# Patient Record
Sex: Female | Born: 1975
Health system: Southern US, Community
[De-identification: ages and names within clinical notes are randomized; demographics above are authoritative.]

## PROBLEM LIST (undated history)

## (undated) DIAGNOSIS — G5600 Carpal tunnel syndrome, unspecified upper limb: Secondary | ICD-10-CM

## (undated) DIAGNOSIS — I10 Essential (primary) hypertension: Secondary | ICD-10-CM

## (undated) DIAGNOSIS — E079 Disorder of thyroid, unspecified: Secondary | ICD-10-CM

## (undated) HISTORY — DX: Carpal tunnel syndrome, unspecified upper limb: G56.00

## (undated) HISTORY — PX: THYROID SURGERY: SHX805

## (undated) HISTORY — DX: Disorder of thyroid, unspecified: E07.9

## (undated) HISTORY — DX: Essential (primary) hypertension: I10

---

## 1999-03-23 ENCOUNTER — Emergency Department (HOSPITAL_COMMUNITY): Admission: EM | Admit: 1999-03-23 | Discharge: 1999-03-23 | Payer: Self-pay | Admitting: Endocrinology

## 2002-08-31 ENCOUNTER — Other Ambulatory Visit: Admission: RE | Admit: 2002-08-31 | Discharge: 2002-08-31 | Payer: Self-pay | Admitting: Obstetrics & Gynecology

## 2003-01-18 ENCOUNTER — Encounter: Admission: RE | Admit: 2003-01-18 | Discharge: 2003-01-18 | Payer: Self-pay | Admitting: Internal Medicine

## 2003-01-18 ENCOUNTER — Encounter: Payer: Self-pay | Admitting: Internal Medicine

## 2005-03-19 ENCOUNTER — Encounter: Admission: RE | Admit: 2005-03-19 | Discharge: 2005-03-19 | Payer: Self-pay | Admitting: Otolaryngology

## 2005-06-02 ENCOUNTER — Ambulatory Visit (HOSPITAL_COMMUNITY): Admission: RE | Admit: 2005-06-02 | Discharge: 2005-06-02 | Payer: Self-pay | Admitting: Endocrinology

## 2005-06-16 ENCOUNTER — Encounter (INDEPENDENT_AMBULATORY_CARE_PROVIDER_SITE_OTHER): Payer: Self-pay | Admitting: Specialist

## 2005-06-16 ENCOUNTER — Other Ambulatory Visit: Admission: RE | Admit: 2005-06-16 | Discharge: 2005-06-16 | Payer: Self-pay | Admitting: Interventional Radiology

## 2005-06-16 ENCOUNTER — Encounter: Admission: RE | Admit: 2005-06-16 | Discharge: 2005-06-16 | Payer: Self-pay | Admitting: Endocrinology

## 2005-08-21 ENCOUNTER — Encounter: Admission: RE | Admit: 2005-08-21 | Discharge: 2005-08-21 | Payer: Self-pay | Admitting: Internal Medicine

## 2005-09-02 ENCOUNTER — Encounter: Admission: RE | Admit: 2005-09-02 | Discharge: 2005-12-01 | Payer: Self-pay | Admitting: Specialist

## 2005-11-26 ENCOUNTER — Encounter: Admission: RE | Admit: 2005-11-26 | Discharge: 2005-11-26 | Payer: Self-pay | Admitting: Internal Medicine

## 2005-11-30 ENCOUNTER — Ambulatory Visit (HOSPITAL_COMMUNITY): Admission: RE | Admit: 2005-11-30 | Discharge: 2005-11-30 | Payer: Self-pay | Admitting: Interventional Cardiology

## 2005-12-24 ENCOUNTER — Encounter: Admission: RE | Admit: 2005-12-24 | Discharge: 2005-12-24 | Payer: Self-pay | Admitting: Internal Medicine

## 2006-07-07 ENCOUNTER — Other Ambulatory Visit: Admission: RE | Admit: 2006-07-07 | Discharge: 2006-07-07 | Payer: Self-pay | Admitting: Obstetrics and Gynecology

## 2006-11-30 IMAGING — CT CT ABDOMEN W/ CM
2 of 5 series · 14 of 32 positions shown, 19 images · IV contrast (READICAT/WATER & [ID] OMNI 300)
Comparison: MRA of the abdomen performed on 11/30/05.

CLINICAL DATA: Abdominal and pelvic pain. 
 ABDOMEN CT WITH CONTRAST:
TECHNIQUE: Multidetector CT imaging of the abdomen was performed following the standard protocol during bolus administration of intravenous contrast.
 Contrast:  Oral contrast and 125 cc Omnipaque 300
TECHNIQUE: Multidetector CT imaging of the pelvis was performed following the standard protocol during bolus administration of intravenous contrast.

[Series 2: routine abdomen · axial · 0.92mm/px · z∈[-305,+20]mm · 6 of 92 slices shown, 11 images]
[im 14/92  soft-tissue]
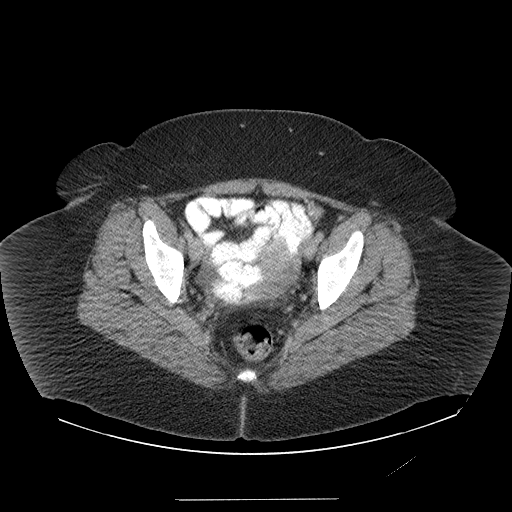
[im 14/92  bone]
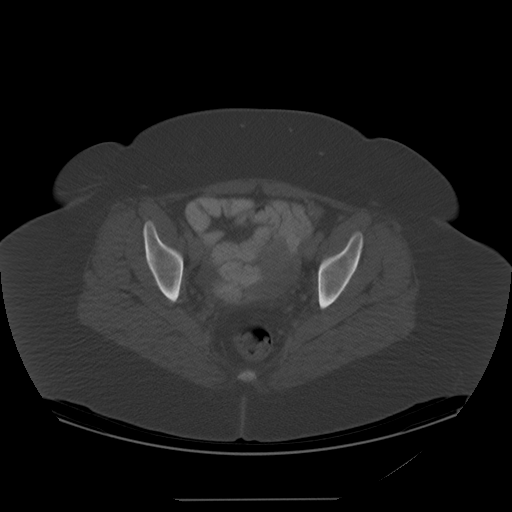
[im 27/92  soft-tissue]
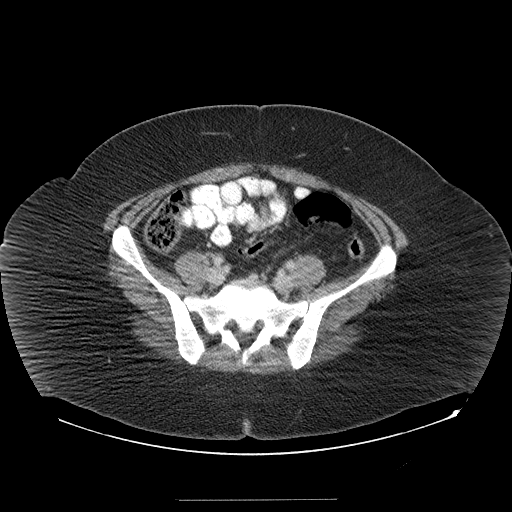
[im 40/92  soft-tissue]
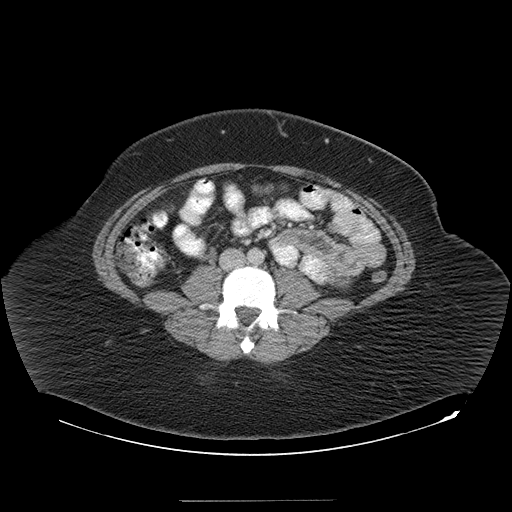
[im 40/92  lung]
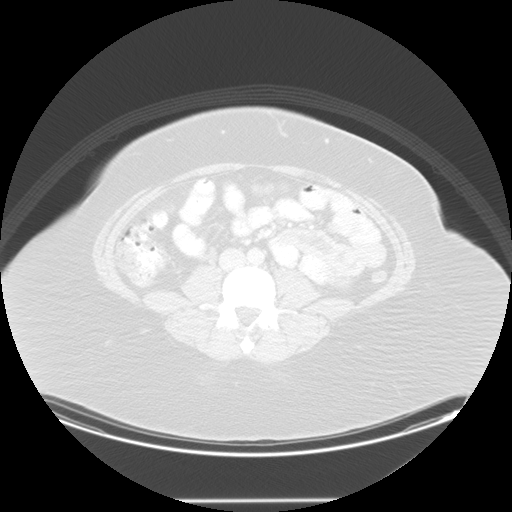
[im 53/92  soft-tissue]
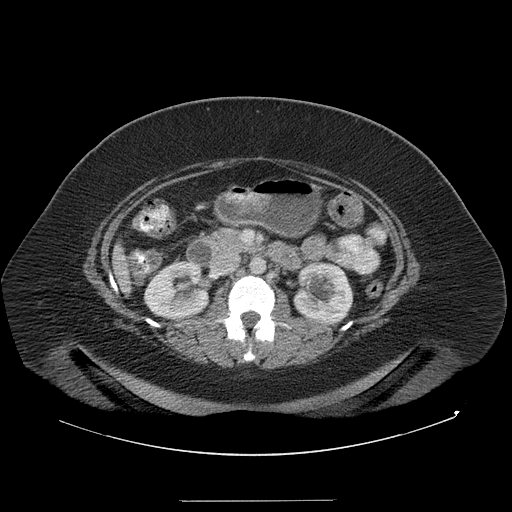
[im 53/92  lung]
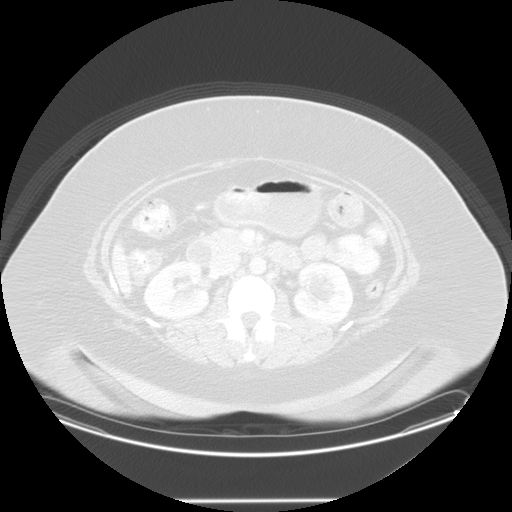
[im 66/92  soft-tissue]
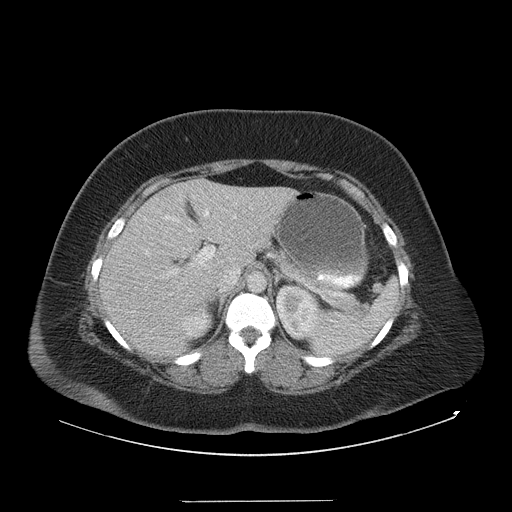
[im 66/92  lung]
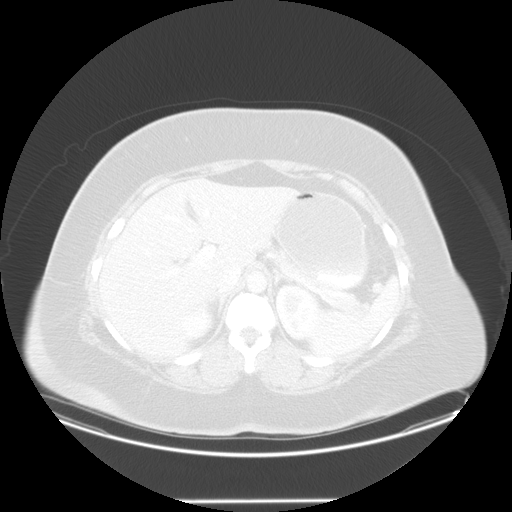
[im 79/92  soft-tissue]
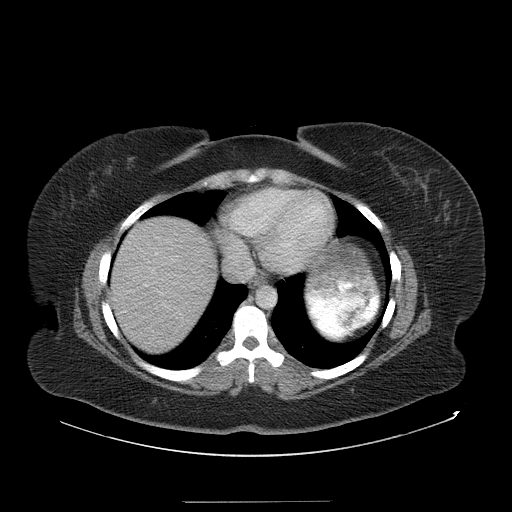
[im 79/92  lung]
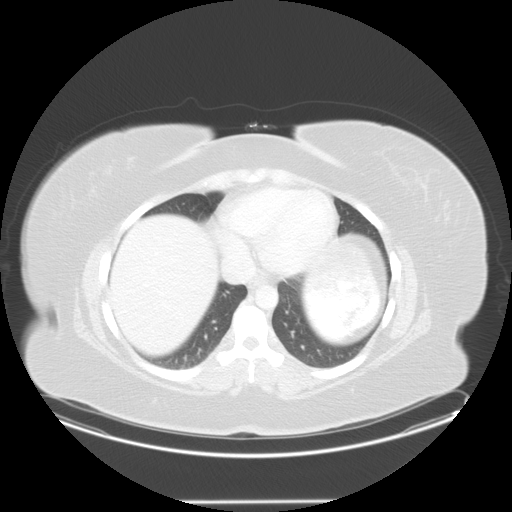

[Series 103: reformatted · sagittal · 1.00mm/px · 8 of 158 slices shown]
[im 14/158  soft-tissue]
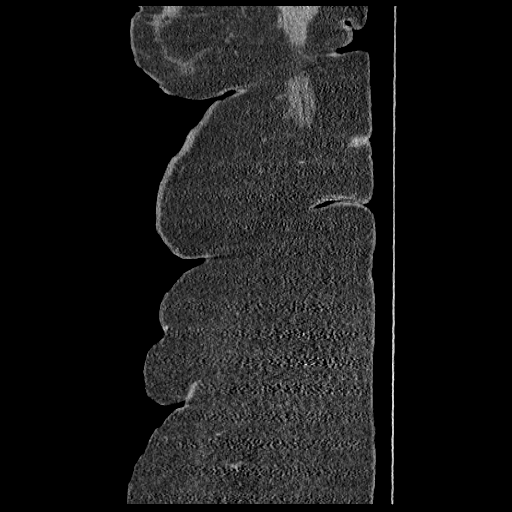
[im 40/158  soft-tissue]
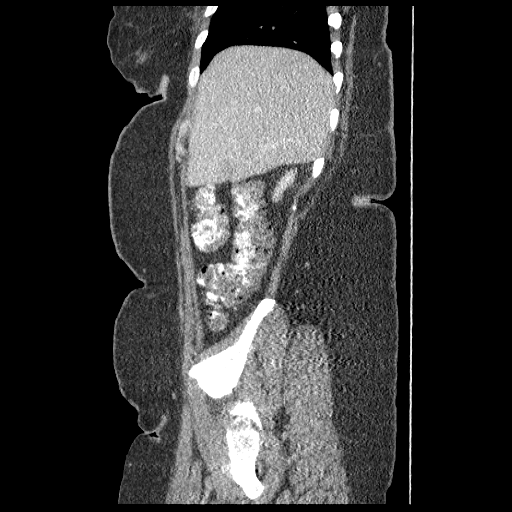
[im 53/158  soft-tissue]
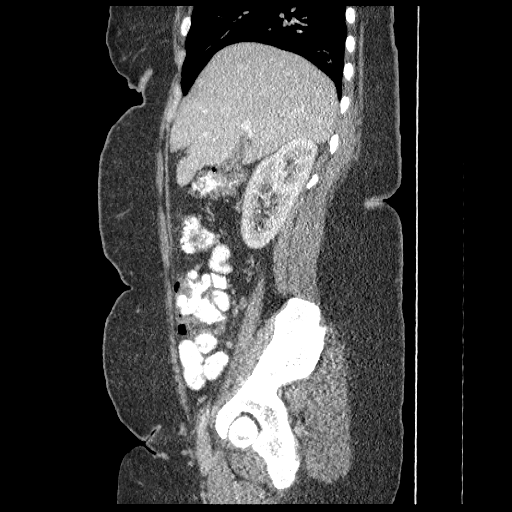
[im 66/158  soft-tissue]
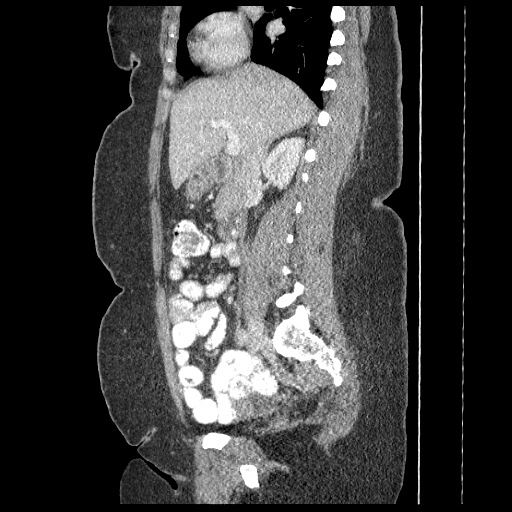
[im 92/158  soft-tissue]
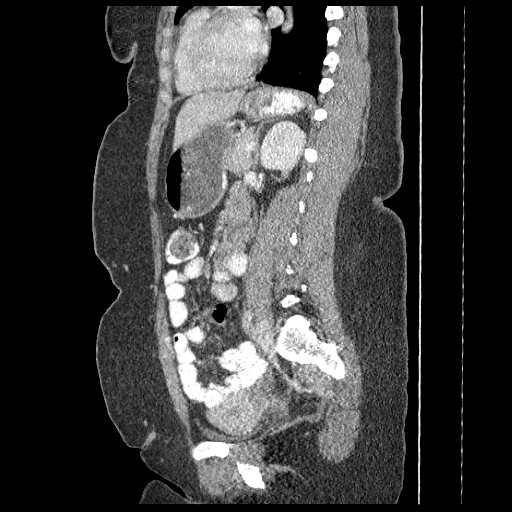
[im 105/158  soft-tissue]
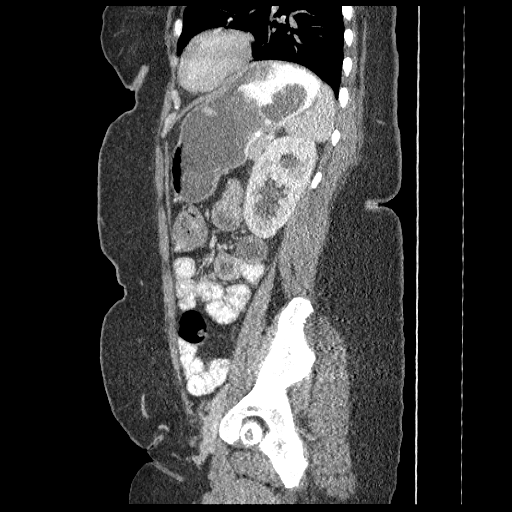
[im 118/158  soft-tissue]
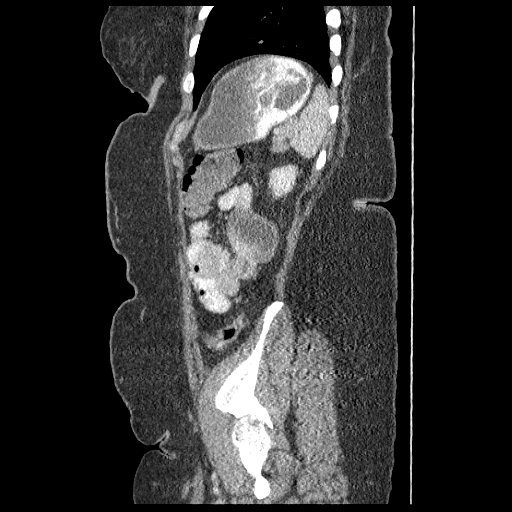
[im 144/158  soft-tissue]
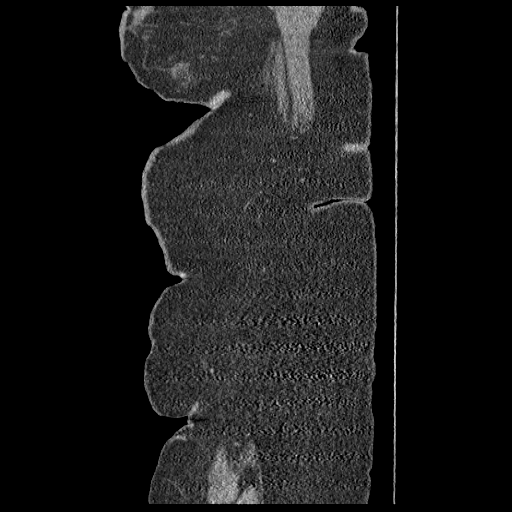

[14 of 32 positions shown; findings below may reference images not displayed]

FINDINGS: The liver, spleen, pancreas, gallbladder, adrenal glands, and kidneys are unremarkable except for bilateral renal parapelvic cysts.  No free fluid, enlarged lymph nodes, abdominal aortic aneurysm, or biliary dilatation identified.  Visualized bowel is unremarkable.
IMPRESSION: No acute abnormality. 
 PELVIS CT WITH CONTRAST:
FINDINGS: Heterogeneous uterus is noted with at least one small anterior fundal fibroid.  The adnexal regions are unremarkable.  No free fluid or enlarged lymph nodes.  The visualized bowel is within normal limits.  The appendix is normal.
IMPRESSION: No acute abnormality.

## 2007-03-06 ENCOUNTER — Emergency Department (HOSPITAL_COMMUNITY): Admission: EM | Admit: 2007-03-06 | Discharge: 2007-03-06 | Payer: Self-pay | Admitting: Emergency Medicine

## 2007-03-06 ENCOUNTER — Encounter (INDEPENDENT_AMBULATORY_CARE_PROVIDER_SITE_OTHER): Payer: Self-pay | Admitting: General Surgery

## 2007-04-18 ENCOUNTER — Encounter: Admission: RE | Admit: 2007-04-18 | Discharge: 2007-04-18 | Payer: Self-pay | Admitting: General Surgery

## 2008-01-25 ENCOUNTER — Other Ambulatory Visit: Admission: RE | Admit: 2008-01-25 | Discharge: 2008-01-25 | Payer: Self-pay | Admitting: Obstetrics and Gynecology

## 2008-04-03 ENCOUNTER — Encounter: Admission: RE | Admit: 2008-04-03 | Discharge: 2008-04-03 | Payer: Self-pay | Admitting: Endocrinology

## 2008-04-25 ENCOUNTER — Encounter: Admission: RE | Admit: 2008-04-25 | Discharge: 2008-04-25 | Payer: Self-pay | Admitting: Endocrinology

## 2008-04-25 ENCOUNTER — Encounter (INDEPENDENT_AMBULATORY_CARE_PROVIDER_SITE_OTHER): Payer: Self-pay | Admitting: Interventional Radiology

## 2008-04-25 ENCOUNTER — Other Ambulatory Visit: Admission: RE | Admit: 2008-04-25 | Discharge: 2008-04-25 | Payer: Self-pay | Admitting: Interventional Radiology

## 2008-04-25 ENCOUNTER — Encounter (INDEPENDENT_AMBULATORY_CARE_PROVIDER_SITE_OTHER): Payer: Self-pay | Admitting: Endocrinology

## 2008-08-30 ENCOUNTER — Encounter: Admission: RE | Admit: 2008-08-30 | Discharge: 2008-08-30 | Payer: Self-pay | Admitting: Endocrinology

## 2009-04-26 ENCOUNTER — Other Ambulatory Visit: Admission: RE | Admit: 2009-04-26 | Discharge: 2009-04-26 | Payer: Self-pay | Admitting: Obstetrics and Gynecology

## 2009-09-25 ENCOUNTER — Encounter: Admission: RE | Admit: 2009-09-25 | Discharge: 2009-09-25 | Payer: Self-pay | Admitting: Endocrinology

## 2010-01-17 ENCOUNTER — Ambulatory Visit (HOSPITAL_COMMUNITY): Admission: RE | Admit: 2010-01-17 | Discharge: 2010-01-18 | Payer: Self-pay | Admitting: General Surgery

## 2010-01-17 ENCOUNTER — Encounter (INDEPENDENT_AMBULATORY_CARE_PROVIDER_SITE_OTHER): Payer: Self-pay | Admitting: General Surgery

## 2010-04-29 ENCOUNTER — Encounter
Admission: RE | Admit: 2010-04-29 | Discharge: 2010-04-29 | Payer: Self-pay | Source: Home / Self Care | Attending: Endocrinology | Admitting: Endocrinology

## 2010-08-17 ENCOUNTER — Encounter: Payer: Self-pay | Admitting: Internal Medicine

## 2010-09-15 ENCOUNTER — Other Ambulatory Visit: Payer: Self-pay | Admitting: Obstetrics and Gynecology

## 2010-09-15 ENCOUNTER — Other Ambulatory Visit (HOSPITAL_COMMUNITY)
Admission: RE | Admit: 2010-09-15 | Discharge: 2010-09-15 | Disposition: A | Discharge status: Home / Self Care | Payer: 59 | Source: Ambulatory Visit | Attending: Obstetrics and Gynecology | Admitting: Obstetrics and Gynecology

## 2010-09-15 DIAGNOSIS — Z01419 Encounter for gynecological examination (general) (routine) without abnormal findings: Secondary | ICD-10-CM | POA: Insufficient documentation

## 2010-10-12 LAB — SURGICAL PCR SCREEN
MRSA, PCR: NEGATIVE
Staphylococcus aureus: NEGATIVE

## 2010-10-12 LAB — DIFFERENTIAL
Basophils Absolute: 0 10*3/uL (ref 0.0–0.1)
Basophils Relative: 0 % (ref 0–1)
Eosinophils Absolute: 0.1 10*3/uL (ref 0.0–0.7)
Eosinophils Relative: 1 % (ref 0–5)
Lymphocytes Relative: 23 % (ref 12–46)
Lymphs Abs: 2.6 10*3/uL (ref 0.7–4.0)
Monocytes Absolute: 0.8 10*3/uL (ref 0.1–1.0)
Monocytes Relative: 7 % (ref 3–12)
Neutro Abs: 7.8 10*3/uL — ABNORMAL HIGH (ref 1.7–7.7)
Neutrophils Relative %: 69 % (ref 43–77)

## 2010-10-12 LAB — CALCIUM
Calcium: 8.9 mg/dL (ref 8.4–10.5)
Calcium: 9.2 mg/dL (ref 8.4–10.5)

## 2010-10-12 LAB — CBC
HCT: 36.2 % (ref 36.0–46.0)
Platelets: 413 10*3/uL — ABNORMAL HIGH (ref 150–400)
RBC: 4.34 MIL/uL (ref 3.87–5.11)
WBC: 11.3 10*3/uL — ABNORMAL HIGH (ref 4.0–10.5)

## 2010-10-12 LAB — BASIC METABOLIC PANEL
BUN: 13 mg/dL (ref 6–23)
GFR calc Af Amer: >60 mL/min (ref >60)
GFR calc non Af Amer: >60 mL/min (ref >60)
Potassium: 2.8 mEq/L — ABNORMAL LOW (ref 3.5–5.1)

## 2010-10-12 LAB — PREGNANCY, URINE: Preg Test, Ur: NEGATIVE

## 2010-10-12 LAB — GLUCOSE, CAPILLARY: Glucose-Capillary: 113 mg/dL — ABNORMAL HIGH (ref 70–99)

## 2010-10-12 LAB — POTASSIUM: Potassium: 3.6 mEq/L (ref 3.5–5.1)

## 2010-12-09 NOTE — Consult Note (Signed)
Nancy Whitney, Nancy Whitney               ACCOUNT NO.:  [id number]   MEDICAL RECORD NO.:  [id number]          PATIENT TYPE:  EMS   LOCATION:  ED                           FACILITY:  [facility]   PHYSICIAN:  Anselm Pancoast. Weatherly, M.D.DATE OF BIRTH:  1975-11-23   DATE OF CONSULTATION:  03/06/2007  DATE OF DISCHARGE:                                 CONSULTATION   CHIEF COMPLAINT:  Breast mass/infection.   HISTORY:  Nancy Whitney is a 35 year old overweight black female who  presented to the emergency room here at [facility] in the early a.m. on  March 06, 2007 with the following history.  She has had an area under  the right breast and in the skin superficial that has been present for  over a year.  Over the last 2 weeks, it has become swollen, tender and  kind of a necrotic area, and then it must have actually drained  spontaneously this evening, for what reason she came to the emergency  room tonight.  She has not had any previous mammograms.  She has not  been on antibiotics for this.  Crista Luria used to be her physician,  and Corlis Hove, and the patient reports that each have actually seen  her for this.  She is not a diabetic.  She does have a history of  hypertension, and she weighs 246 pounds.  Dr. Ignacia Palma saw her in the  emergency room.  She was afebrile and asked for a surgical opinion, and  I saw her approximately 1:30 a.m.  She is afebrile.  Blood pressure is  mildly elevated, and I see an area that looks like a necrotic area of  skin with a thick center portion that is just absent, and I cannot  appreciate a definite mass in the breast.  There are obvious areas of  localized cellulitis around this area, and then she has got a smaller,  what looks like a sebaceous cyst that is draining in the right lateral  abdominal well that is about 6 inches below the breast, and then this  little area on the base of her neck that looks like a little sebaceous  cyst, and she said each of these  areas kind of drain intermittently.  She has no past history of MRSA that she is aware of.  The area of the  breast certainly is inflamed enough it could possibly be MRSA, and I  recommended to her that I go ahead and debride the necrotic skin and  send this for pathology exam to make sure we are not dealing with a  breast inflammatory malignant lesion; I think it very unlikely, and a  culture has already been sent.  I would do the debridement and then let  me see her in the office in 2-3 days.  She will need a mammogram or at  least a breast ultrasound after the acute inflammation has subsided.  Whether these areas will ultimately no evidence of disease to be excised  is certainly in question, but I would wait for the results of the biopsy  and see what  the antibiotics are going to do before making that  decision.  I also want her to get in the shower and soak twice a day,  scrubbing these areas and then apply a little Betadine solution to the  abscess cavities for the next few days.  I am going to put her on Septra  DS one b.i.d., and she is aware that I may switch her to Keflex if this  is just a culture that is not MRSA and is sensitive to Keflex.   IMPRESSION:  Probably kind of a variation of a sebaceous cyst in these  areas where the skin rubs one another that is significantly infected  with necrotic overlying skin on the right breast and a sebaceous cyst,  right lateral chest wall on the base of the neck.   I also gave her some Vicodin for pain medication, in addition to the  Septra DS.  I gave her 20 tablets of each of the Septra and the Vicodin.  She will call Monday for an appointment either Monday or Tuesday in my  office.           ______________________________  Anselm Pancoast. Zachery Dakins, M.D.    WJW/MEDQ  D:  03/06/2007  T:  03/07/2007  Job:  [id number]

## 2011-05-11 LAB — WOUND CULTURE: Gram Stain: NONE SEEN

## 2012-02-01 ENCOUNTER — Other Ambulatory Visit (HOSPITAL_COMMUNITY)
Admission: RE | Admit: 2012-02-01 | Discharge: 2012-02-01 | Disposition: A | Discharge status: Home / Self Care | Payer: 59 | Source: Ambulatory Visit | Attending: Obstetrics and Gynecology | Admitting: Obstetrics and Gynecology

## 2012-02-01 ENCOUNTER — Other Ambulatory Visit: Payer: Self-pay | Admitting: Obstetrics and Gynecology

## 2012-02-01 DIAGNOSIS — Z01419 Encounter for gynecological examination (general) (routine) without abnormal findings: Secondary | ICD-10-CM | POA: Insufficient documentation

## 2013-02-01 ENCOUNTER — Other Ambulatory Visit (HOSPITAL_COMMUNITY)
Admission: RE | Admit: 2013-02-01 | Discharge: 2013-02-01 | Disposition: A | Discharge status: Home / Self Care | Payer: 59 | Source: Ambulatory Visit | Attending: Obstetrics and Gynecology | Admitting: Obstetrics and Gynecology

## 2013-02-01 ENCOUNTER — Other Ambulatory Visit: Payer: Self-pay | Admitting: Obstetrics and Gynecology

## 2013-02-01 DIAGNOSIS — Z01419 Encounter for gynecological examination (general) (routine) without abnormal findings: Secondary | ICD-10-CM | POA: Insufficient documentation

## 2013-02-01 DIAGNOSIS — N6310 Unspecified lump in the right breast, unspecified quadrant: Secondary | ICD-10-CM

## 2013-02-01 DIAGNOSIS — Z1151 Encounter for screening for human papillomavirus (HPV): Secondary | ICD-10-CM | POA: Insufficient documentation

## 2013-02-10 ENCOUNTER — Other Ambulatory Visit: Payer: 59

## 2013-03-01 ENCOUNTER — Ambulatory Visit
Admission: RE | Admit: 2013-03-01 | Discharge: 2013-03-01 | Disposition: A | Discharge status: Home / Self Care | Payer: 59 | Source: Ambulatory Visit | Attending: Obstetrics and Gynecology | Admitting: Obstetrics and Gynecology

## 2013-03-01 DIAGNOSIS — N6310 Unspecified lump in the right breast, unspecified quadrant: Secondary | ICD-10-CM

## 2013-12-12 ENCOUNTER — Ambulatory Visit (INDEPENDENT_AMBULATORY_CARE_PROVIDER_SITE_OTHER): Payer: 59

## 2013-12-12 ENCOUNTER — Encounter: Payer: Self-pay | Admitting: Podiatry

## 2013-12-12 ENCOUNTER — Ambulatory Visit (INDEPENDENT_AMBULATORY_CARE_PROVIDER_SITE_OTHER): Payer: 59 | Admitting: Podiatry

## 2013-12-12 VITALS — BP 139/76 | HR 92 | Resp 16 | Ht 64.0 in | Wt 278.0 lb

## 2013-12-12 DIAGNOSIS — M722 Plantar fascial fibromatosis: Secondary | ICD-10-CM

## 2013-12-12 MED ORDER — MELOXICAM 15 MG PO TABS: 15.0000 mg | ORAL_TABLET | Freq: Every day | ORAL | Status: AC

## 2013-12-12 MED ORDER — METHYLPREDNISOLONE (PAK) 4 MG PO TABS
ORAL_TABLET | ORAL | Status: AC
Start: 2013-12-12 — End: ?

## 2013-12-12 NOTE — Patient Instructions (Signed)

## 2013-12-12 NOTE — Progress Notes (Signed)
She presents today states that she's been having trouble with his heels since last September. The lateral aspect of her bilateral foot are doing much better. She states that she's been on her feet all day they become very painful and they're painful first thing in the mornings and after she's been sitting for a while and gets up to ambulate.  Objective: Vital signs are stable she is alert and oriented x3. Pulses are strongly palpable bilateral. Muscle strength is 5 over 5 dorsiflexors plantar flexors inverters everters all visible sutures intact. She has pain on palpation of the medial calcaneal tubercle of the right heel. Radiographic evaluation does demonstrate a soft tissue increase in density at the plantar fascial calcaneal insertion site indicative of plantar fasciitis.  Assessment: Plantar fasciitis left foot.  Plan: Injected the left foot today with Kenalog and local anesthetic. Put her on a Sterapred Dosepak to be followed by mouth. Put her in a plantar fascial brace to and a night splint. Discussed appropriate shoe gear stretching exercises ice therapy and shoe gear modifications. I will followup with her in one month

## 2014-01-18 ENCOUNTER — Encounter: Payer: Self-pay | Admitting: Podiatry

## 2014-01-18 ENCOUNTER — Ambulatory Visit (INDEPENDENT_AMBULATORY_CARE_PROVIDER_SITE_OTHER): Payer: 59 | Admitting: Podiatry

## 2014-01-18 VITALS — BP 137/86 | HR 92 | Resp 17 | Ht 64.0 in | Wt 276.0 lb

## 2014-01-18 DIAGNOSIS — M722 Plantar fascial fibromatosis: Secondary | ICD-10-CM

## 2014-01-18 NOTE — Progress Notes (Signed)
She presents today for followup of plantar fasciitis to her left foot. She states that she's proximally 70-75% improved however it seems to have hit a plateau. He continues all other conservative therapies at home.  Objective: Vital signs are stable she is alert and oriented x3. Pulses are palpable bilateral. She has pain on palpation medial continued tubercle to the left heel but minimally so.  Assessment: Plantar fasciitis left.  Plan: Secondary to plantar fasciitis in pes planus I believe orthotics will be necessary we scanned her today for a set of orthotics and off followup with her once those come in. We did discuss the possible need for dehydrated alcohol injections

## 2014-02-12 ENCOUNTER — Other Ambulatory Visit (HOSPITAL_COMMUNITY)
Admission: RE | Admit: 2014-02-12 | Discharge: 2014-02-12 | Disposition: A | Discharge status: Home / Self Care | Payer: 59 | Source: Ambulatory Visit | Attending: Obstetrics and Gynecology | Admitting: Obstetrics and Gynecology

## 2014-02-12 ENCOUNTER — Other Ambulatory Visit: Payer: Self-pay | Admitting: Obstetrics and Gynecology

## 2014-02-12 ENCOUNTER — Ambulatory Visit (INDEPENDENT_AMBULATORY_CARE_PROVIDER_SITE_OTHER): Payer: 59 | Admitting: *Deleted

## 2014-02-12 ENCOUNTER — Other Ambulatory Visit: Payer: 59

## 2014-02-12 ENCOUNTER — Telehealth: Payer: Self-pay | Admitting: *Deleted

## 2014-02-12 DIAGNOSIS — M722 Plantar fascial fibromatosis: Secondary | ICD-10-CM

## 2014-02-12 DIAGNOSIS — Z01419 Encounter for gynecological examination (general) (routine) without abnormal findings: Secondary | ICD-10-CM | POA: Insufficient documentation

## 2014-02-12 NOTE — Progress Notes (Signed)
   Subjective:    Patient ID: Nancy Whitney, female    DOB: 1976-01-29, 38 y.o.   MRN: [id number]  HPI  puo and given instruction.  Review of Systems     Objective:   Physical Exam        Assessment & Plan:

## 2014-02-12 NOTE — Patient Instructions (Signed)

## 2014-02-12 NOTE — Telephone Encounter (Signed)
Had an appointment today at 11:30am to be fitted for orthotics.  I received a call telling me the orthotics are not in.  When do you expect them to be in so I can schedule an appointment?  Dario Guardiannna P stated she spoke to the patient.  She said she found the orthotics and called and had the patient to come in to pick them up.

## 2014-02-14 LAB — CYTOLOGY - PAP

## 2014-05-10 ENCOUNTER — Ambulatory Visit: Payer: 59 | Admitting: Podiatry

## 2014-06-28 ENCOUNTER — Ambulatory Visit: Payer: 59 | Admitting: Podiatry

## 2014-07-03 ENCOUNTER — Ambulatory Visit: Payer: 59 | Admitting: Podiatry

## 2014-07-05 ENCOUNTER — Ambulatory Visit: Payer: 59 | Admitting: Podiatry

## 2014-07-12 ENCOUNTER — Ambulatory Visit: Payer: 59 | Admitting: Podiatry

## 2014-07-30 ENCOUNTER — Encounter: Payer: 59 | Admitting: Neurology

## 2014-08-06 ENCOUNTER — Ambulatory Visit (INDEPENDENT_AMBULATORY_CARE_PROVIDER_SITE_OTHER): Payer: Self-pay | Admitting: Neurology

## 2014-08-06 ENCOUNTER — Encounter: Payer: Self-pay | Admitting: Neurology

## 2014-08-06 ENCOUNTER — Ambulatory Visit (INDEPENDENT_AMBULATORY_CARE_PROVIDER_SITE_OTHER): Payer: 59 | Admitting: Neurology

## 2014-08-06 DIAGNOSIS — G5602 Carpal tunnel syndrome, left upper limb: Secondary | ICD-10-CM

## 2014-08-06 DIAGNOSIS — R202 Paresthesia of skin: Secondary | ICD-10-CM

## 2014-08-06 DIAGNOSIS — G5600 Carpal tunnel syndrome, unspecified upper limb: Secondary | ICD-10-CM | POA: Insufficient documentation

## 2014-08-06 HISTORY — DX: Carpal tunnel syndrome, unspecified upper limb: G56.00

## 2014-08-06 NOTE — Progress Notes (Signed)
Please refer to EMG and nerve conduction study procedure note. 

## 2014-08-06 NOTE — Procedures (Signed)
     HISTORY:  Nancy Whitney is a 39 year old patient with a history of obesity and thyroid disease. She has a six-month history of numbness affecting mainly the fourth and fifth fingers of the hands, left greater than right. She denies any discomfort involving the neck. She is being evaluated for a possible neuropathy or a cervical radiculopathy.  NERVE CONDUCTION STUDIES:  Nerve conduction studies were performed on both upper extremities. The distal motor latencies for the median nerves were prolonged on the left, normal on the right, with normal motor amplitudes for these nerves bilaterally. The distal motor latencies and motor amplitudes for the ulnar nerves were normal bilaterally. The F wave latencies and nerve conduction velocities for the median and ulnar nerves were normal bilaterally. The sensory latencies for the median nerves were normal on the right, prolonged on the left, and normal for the ulnar nerves bilaterally.  EMG STUDIES:  EMG study was performed on the left upper extremity:  The first dorsal interosseous muscle reveals 2 to 4 K units with full recruitment. No fibrillations or positive waves were noted. The abductor pollicis brevis muscle reveals 2 to 4 K units with full recruitment. No fibrillations or positive waves were noted. The extensor indicis proprius muscle reveals 1 to 3 K units with full recruitment. No fibrillations or positive waves were noted. The pronator teres muscle reveals 2 to 3 K units with full recruitment. No fibrillations or positive waves were noted. The biceps muscle reveals 1 to 2 K units with full recruitment. No fibrillations or positive waves were noted. The triceps muscle reveals 2 to 4 K units with full recruitment. No fibrillations or positive waves were noted. The anterior deltoid muscle reveals 2 to 3 K units with full recruitment. No fibrillations or positive waves were noted. The cervical paraspinal muscles were tested at 2 levels. No  abnormalities of insertional activity were seen at either level tested. There was good relaxation.  A limited EMG study was performed on the right upper extremity:  The first dorsal interosseous muscle reveals 2 to 4 K units with full recruitment. No fibrillations or positive waves were noted. The abductor pollicis brevis muscle reveals 2 to 4 K units with full recruitment. No fibrillations or positive waves were noted. The extensor indicis proprius muscle reveals 1 to 3 K units with full recruitment. No fibrillations or positive waves were noted.   IMPRESSION:  Nerve conduction studies done on both upper extremities reveals evidence of a mild left-sided carpal tunnel syndrome. EMG evaluation of the left upper extremity was unremarkable, without evidence of an overlying cervical radiculopathy. A limited EMG study of the right upper extremity was unremarkable. There is no evidence of an ulnar neuropathy on either side.  Marlan Palau. Keith Willis MD 08/06/2014 2:02 PM  [facility] [address] [place], [place] [id number]  Phone [phone] Fax [phone]

## 2015-02-18 ENCOUNTER — Other Ambulatory Visit: Payer: Self-pay | Admitting: Obstetrics and Gynecology

## 2015-02-18 ENCOUNTER — Other Ambulatory Visit (HOSPITAL_COMMUNITY)
Admission: RE | Admit: 2015-02-18 | Discharge: 2015-02-18 | Disposition: A | Discharge status: Home / Self Care | Payer: Self-pay | Source: Ambulatory Visit | Attending: Obstetrics and Gynecology | Admitting: Obstetrics and Gynecology

## 2015-02-18 DIAGNOSIS — Z01419 Encounter for gynecological examination (general) (routine) without abnormal findings: Secondary | ICD-10-CM | POA: Insufficient documentation

## 2015-02-19 LAB — CYTOLOGY - PAP

## 2016-02-24 ENCOUNTER — Other Ambulatory Visit (HOSPITAL_COMMUNITY)
Admission: RE | Admit: 2016-02-24 | Discharge: 2016-02-24 | Disposition: A | Discharge status: Home / Self Care | Payer: 59 | Source: Ambulatory Visit | Attending: Obstetrics and Gynecology | Admitting: Obstetrics and Gynecology

## 2016-02-24 ENCOUNTER — Other Ambulatory Visit: Payer: Self-pay | Admitting: Obstetrics and Gynecology

## 2016-02-24 DIAGNOSIS — Z01419 Encounter for gynecological examination (general) (routine) without abnormal findings: Secondary | ICD-10-CM | POA: Diagnosis present

## 2016-02-24 DIAGNOSIS — Z1151 Encounter for screening for human papillomavirus (HPV): Secondary | ICD-10-CM | POA: Diagnosis present

## 2016-02-25 LAB — CYTOLOGY - PAP

## 2016-09-08 DIAGNOSIS — G4733 Obstructive sleep apnea (adult) (pediatric): Secondary | ICD-10-CM | POA: Diagnosis not present

## 2016-11-17 DIAGNOSIS — I10 Essential (primary) hypertension: Secondary | ICD-10-CM | POA: Diagnosis not present

## 2016-11-17 DIAGNOSIS — G4733 Obstructive sleep apnea (adult) (pediatric): Secondary | ICD-10-CM | POA: Diagnosis not present

## 2017-02-18 DIAGNOSIS — R7301 Impaired fasting glucose: Secondary | ICD-10-CM | POA: Diagnosis not present

## 2017-02-18 DIAGNOSIS — E890 Postprocedural hypothyroidism: Secondary | ICD-10-CM | POA: Diagnosis not present

## 2017-02-25 DIAGNOSIS — I10 Essential (primary) hypertension: Secondary | ICD-10-CM | POA: Diagnosis not present

## 2017-02-25 DIAGNOSIS — R7301 Impaired fasting glucose: Secondary | ICD-10-CM | POA: Diagnosis not present

## 2017-02-25 DIAGNOSIS — E890 Postprocedural hypothyroidism: Secondary | ICD-10-CM | POA: Diagnosis not present

## 2017-03-08 DIAGNOSIS — Z01419 Encounter for gynecological examination (general) (routine) without abnormal findings: Secondary | ICD-10-CM | POA: Diagnosis not present

## 2017-06-08 DIAGNOSIS — I10 Essential (primary) hypertension: Secondary | ICD-10-CM | POA: Diagnosis not present

## 2017-06-08 DIAGNOSIS — E039 Hypothyroidism, unspecified: Secondary | ICD-10-CM | POA: Diagnosis not present

## 2017-09-20 ENCOUNTER — Other Ambulatory Visit: Payer: Self-pay | Admitting: Obstetrics and Gynecology

## 2017-09-20 DIAGNOSIS — Z1231 Encounter for screening mammogram for malignant neoplasm of breast: Secondary | ICD-10-CM

## 2017-10-22 ENCOUNTER — Ambulatory Visit
Admission: RE | Admit: 2017-10-22 | Discharge: 2017-10-22 | Disposition: A | Discharge status: Home / Self Care | Payer: 59 | Source: Ambulatory Visit | Attending: Obstetrics and Gynecology | Admitting: Obstetrics and Gynecology

## 2017-10-22 DIAGNOSIS — Z1231 Encounter for screening mammogram for malignant neoplasm of breast: Secondary | ICD-10-CM

## 2017-10-26 ENCOUNTER — Other Ambulatory Visit: Payer: Self-pay | Admitting: Obstetrics and Gynecology

## 2017-10-26 DIAGNOSIS — R928 Other abnormal and inconclusive findings on diagnostic imaging of breast: Secondary | ICD-10-CM

## 2017-11-02 ENCOUNTER — Ambulatory Visit
Admission: RE | Admit: 2017-11-02 | Discharge: 2017-11-02 | Disposition: A | Discharge status: Home / Self Care | Payer: 59 | Source: Ambulatory Visit | Attending: Obstetrics and Gynecology | Admitting: Obstetrics and Gynecology

## 2017-11-02 ENCOUNTER — Ambulatory Visit: Payer: 59

## 2017-11-02 DIAGNOSIS — R928 Other abnormal and inconclusive findings on diagnostic imaging of breast: Secondary | ICD-10-CM

## 2017-11-02 DIAGNOSIS — N6320 Unspecified lump in the left breast, unspecified quadrant: Secondary | ICD-10-CM | POA: Diagnosis not present

## 2018-03-01 DIAGNOSIS — I10 Essential (primary) hypertension: Secondary | ICD-10-CM | POA: Diagnosis not present

## 2018-03-01 DIAGNOSIS — R7301 Impaired fasting glucose: Secondary | ICD-10-CM | POA: Diagnosis not present

## 2018-03-01 DIAGNOSIS — E890 Postprocedural hypothyroidism: Secondary | ICD-10-CM | POA: Diagnosis not present

## 2018-03-14 DIAGNOSIS — I10 Essential (primary) hypertension: Secondary | ICD-10-CM | POA: Diagnosis not present

## 2018-03-14 DIAGNOSIS — E039 Hypothyroidism, unspecified: Secondary | ICD-10-CM | POA: Diagnosis not present

## 2018-03-14 DIAGNOSIS — Z01419 Encounter for gynecological examination (general) (routine) without abnormal findings: Secondary | ICD-10-CM | POA: Diagnosis not present

## 2018-04-13 DIAGNOSIS — Z3042 Encounter for surveillance of injectable contraceptive: Secondary | ICD-10-CM | POA: Diagnosis not present

## 2018-07-11 DIAGNOSIS — Z3042 Encounter for surveillance of injectable contraceptive: Secondary | ICD-10-CM | POA: Diagnosis not present

## 2018-10-07 DIAGNOSIS — Z3042 Encounter for surveillance of injectable contraceptive: Secondary | ICD-10-CM | POA: Diagnosis not present

## 2018-10-19 DIAGNOSIS — G4733 Obstructive sleep apnea (adult) (pediatric): Secondary | ICD-10-CM | POA: Diagnosis not present

## 2018-10-19 DIAGNOSIS — I10 Essential (primary) hypertension: Secondary | ICD-10-CM | POA: Diagnosis not present

## 2018-10-19 DIAGNOSIS — E039 Hypothyroidism, unspecified: Secondary | ICD-10-CM | POA: Diagnosis not present

## 2019-03-21 ENCOUNTER — Other Ambulatory Visit (HOSPITAL_COMMUNITY)
Admission: RE | Admit: 2019-03-21 | Discharge: 2019-03-21 | Disposition: A | Discharge status: Home / Self Care | Payer: 59 | Source: Ambulatory Visit | Attending: Obstetrics and Gynecology | Admitting: Obstetrics and Gynecology

## 2019-03-21 DIAGNOSIS — Z01419 Encounter for gynecological examination (general) (routine) without abnormal findings: Secondary | ICD-10-CM | POA: Diagnosis present

## 2019-03-22 ENCOUNTER — Other Ambulatory Visit: Payer: Self-pay | Admitting: Obstetrics and Gynecology

## 2019-03-27 LAB — CYTOLOGY - PAP
Diagnosis: NEGATIVE
HPV: NOT DETECTED
